# Patient Record
Sex: Male | Born: 1955 | Race: Black or African American | Hispanic: No | Marital: Married
Health system: Southern US, Community
[De-identification: ages and names within clinical notes are randomized; demographics above are authoritative.]

---

## 2006-02-09 ENCOUNTER — Ambulatory Visit: Payer: Self-pay | Admitting: Cardiology

## 2006-02-09 ENCOUNTER — Inpatient Hospital Stay (HOSPITAL_COMMUNITY): Admission: EM | Admit: 2006-02-09 | Discharge: 2006-02-12 | Payer: Self-pay | Admitting: Emergency Medicine

## 2006-02-13 ENCOUNTER — Emergency Department (HOSPITAL_COMMUNITY): Admission: EM | Admit: 2006-02-13 | Discharge: 2006-02-14 | Payer: Self-pay | Admitting: Emergency Medicine

## 2006-02-17 ENCOUNTER — Ambulatory Visit: Payer: Self-pay | Admitting: *Deleted

## 2008-08-11 IMAGING — CR DG CHEST 1V PORT
1 series · 1 of 1 positions shown · non-contrast
Comparison: none

CLINICAL DATA: Chest pain.
 PORTABLE CHEST - 1 VIEW, 02/09/06 AT 8010 HOURS:

[view not recorded]
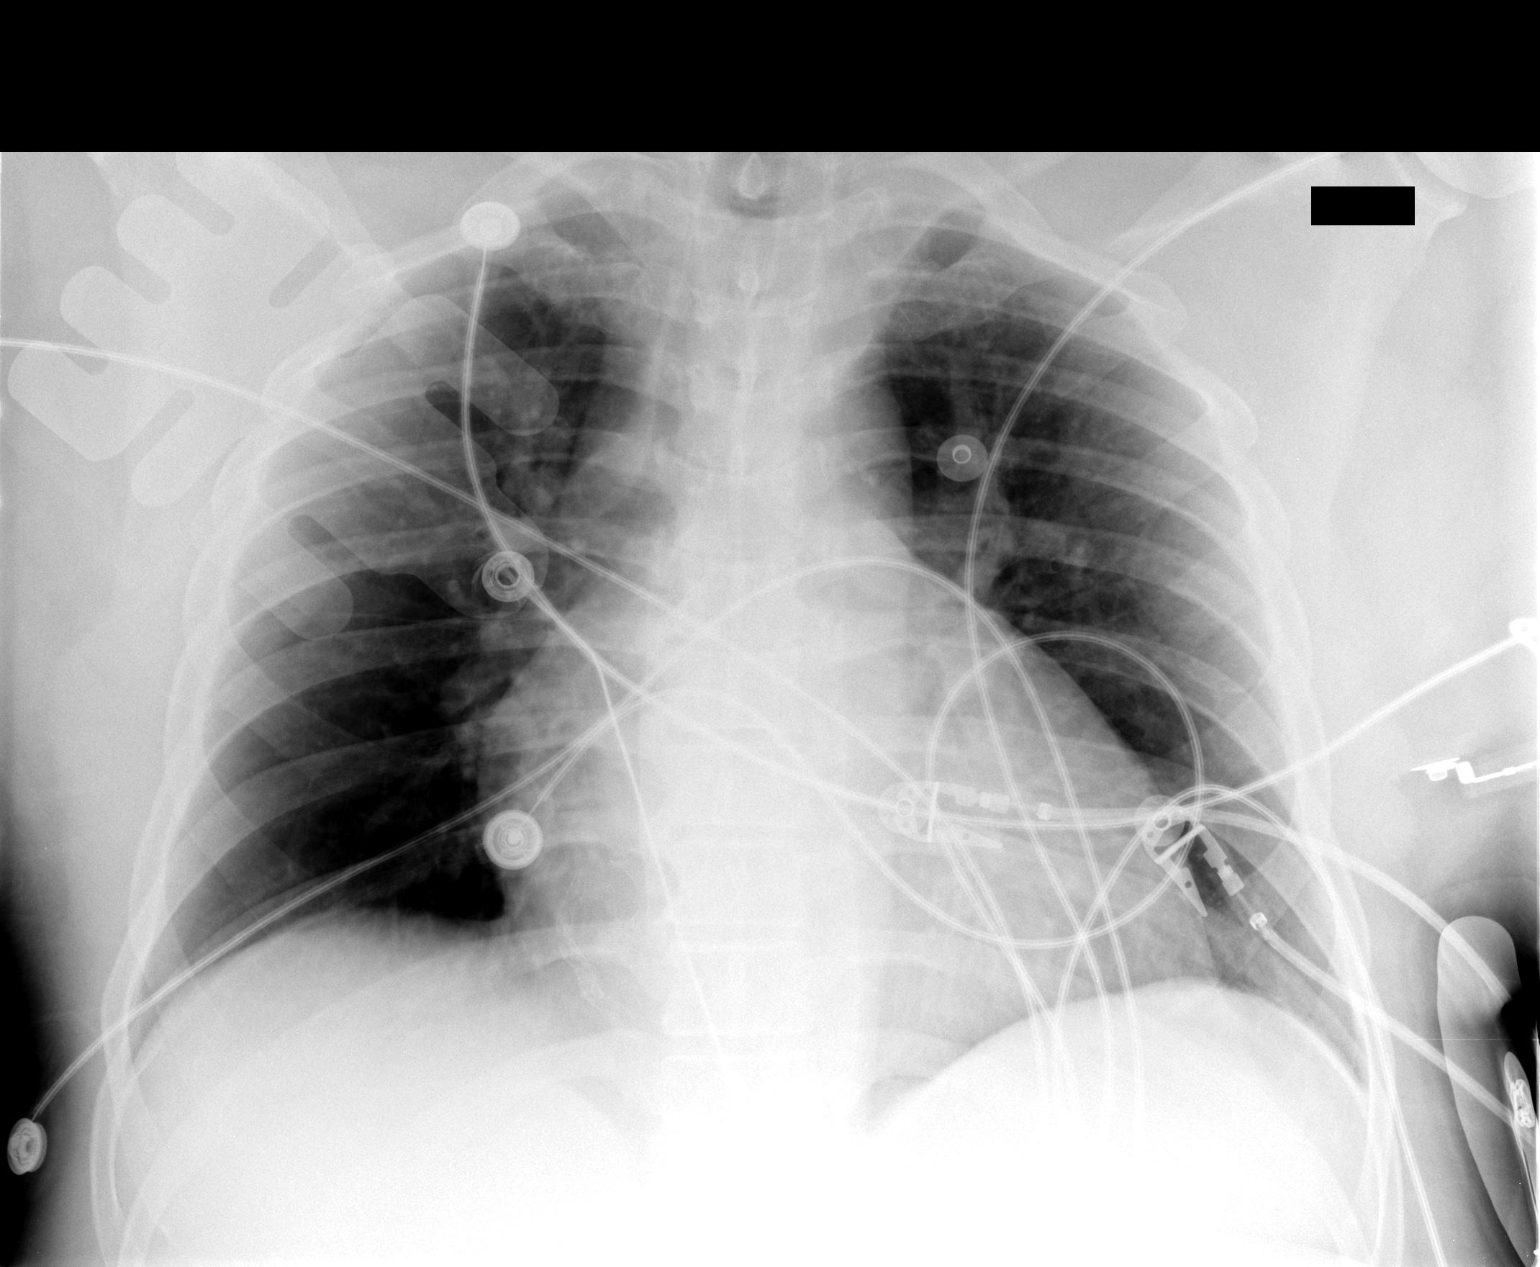

[1 of 1 positions shown; findings below may reference images not displayed]

FINDINGS: The lungs are clear. There is cardiomegaly present.  No bony abnormality is present.
IMPRESSION: Cardiomegaly.  No active lung disease.

## 2020-12-13 ENCOUNTER — Ambulatory Visit: Payer: Self-pay | Attending: Family

## 2020-12-13 DIAGNOSIS — Z23 Encounter for immunization: Secondary | ICD-10-CM

## 2020-12-30 NOTE — Progress Notes (Signed)
   [facility]  Name:  Timothy Booth    MRN: [id number] DOB: 1955/03/05  12/30/2020  Mr. Timothy Booth was observed post Covid-19 immunization for 15 minutes without incident. He was provided with Vaccine Information Sheet and instruction to access the V-Safe system.   Mr. Timothy Booth was instructed to call 911 with any severe reactions post vaccine: Difficulty breathing  Swelling of face and throat  A fast heartbeat  A bad rash all over body  Dizziness and weakness
